# Patient Record
Sex: Male | Born: 1985 | Race: Black or African American | Hispanic: No | Marital: Single
Health system: Southern US, Community
[De-identification: ages and names within clinical notes are randomized; demographics above are authoritative.]

## PROBLEM LIST (undated history)

## (undated) HISTORY — PX: FINGER FRACTURE SURGERY: SHX638

---
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
## 2006-06-07 ENCOUNTER — Encounter: Admission: RE | Admit: 2006-06-07 | Discharge: 2006-06-07 | Payer: Self-pay | Admitting: Orthopedic Surgery

## 2006-06-28 ENCOUNTER — Encounter: Admission: RE | Admit: 2006-06-28 | Discharge: 2006-09-01 | Payer: Self-pay | Admitting: Orthopedic Surgery

## 2006-12-30 ENCOUNTER — Emergency Department (HOSPITAL_COMMUNITY): Admission: EM | Admit: 2006-12-30 | Discharge: 2006-12-30 | Payer: Self-pay | Admitting: Emergency Medicine

## 2007-01-01 ENCOUNTER — Emergency Department (HOSPITAL_COMMUNITY): Admission: EM | Admit: 2007-01-01 | Discharge: 2007-01-01 | Payer: Self-pay | Admitting: Emergency Medicine

## 2009-05-08 ENCOUNTER — Encounter: Admission: RE | Admit: 2009-05-08 | Discharge: 2009-05-08 | Payer: Self-pay | Admitting: Internal Medicine

## 2009-11-25 ENCOUNTER — Encounter: Admission: RE | Admit: 2009-11-25 | Discharge: 2009-11-25 | Payer: Self-pay | Admitting: Urology

## 2010-10-13 ENCOUNTER — Encounter: Payer: Self-pay | Admitting: Internal Medicine

## 2010-12-13 ENCOUNTER — Emergency Department (HOSPITAL_BASED_OUTPATIENT_CLINIC_OR_DEPARTMENT_OTHER)
Admission: EM | Admit: 2010-12-13 | Discharge: 2010-12-13 | Disposition: A | Discharge status: Home / Self Care | Payer: Managed Care, Other (non HMO) | Attending: Emergency Medicine | Admitting: Emergency Medicine

## 2010-12-13 ENCOUNTER — Emergency Department (INDEPENDENT_AMBULATORY_CARE_PROVIDER_SITE_OTHER): Payer: Managed Care, Other (non HMO)

## 2010-12-13 DIAGNOSIS — S62309A Unspecified fracture of unspecified metacarpal bone, initial encounter for closed fracture: Secondary | ICD-10-CM | POA: Insufficient documentation

## 2010-12-13 DIAGNOSIS — IMO0002 Reserved for concepts with insufficient information to code with codable children: Secondary | ICD-10-CM | POA: Insufficient documentation

## 2011-11-29 IMAGING — CR DG HAND COMPLETE 3+V*R*
3 series · 3 of 3 positions shown · non-contrast
Comparison: None

CLINICAL DATA: Injury, fourth finger pain.

RIGHT HAND - COMPLETE 3+ VIEW

[x hand pa right]
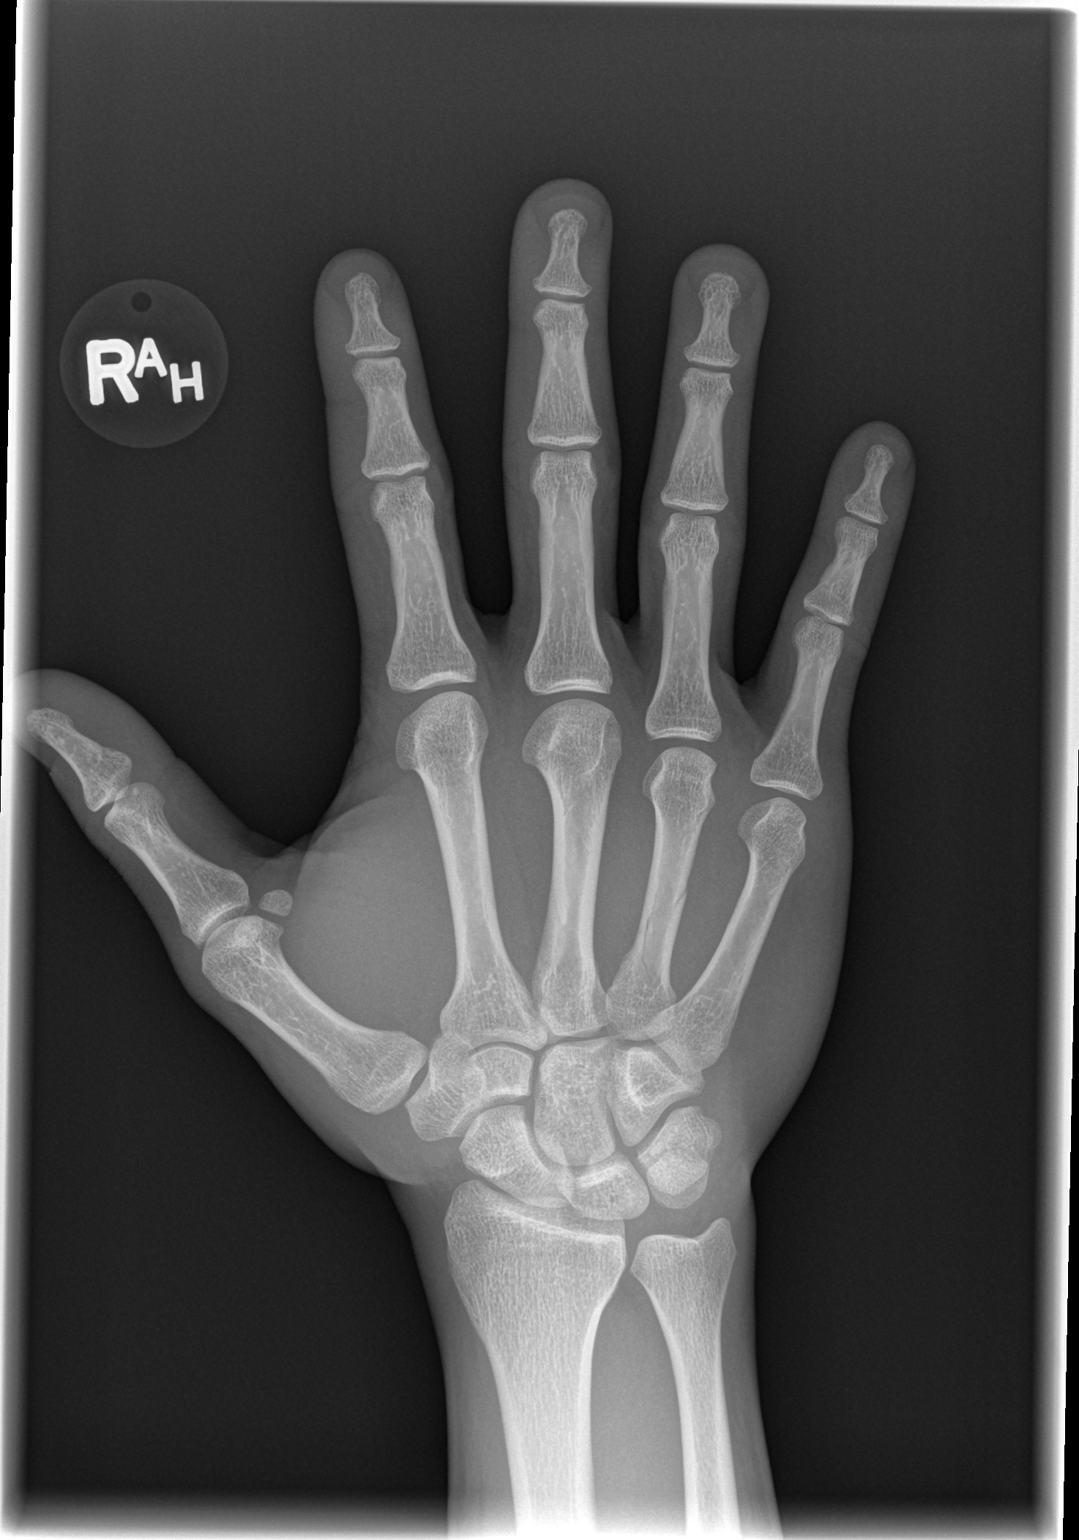

[x hand oblique right]
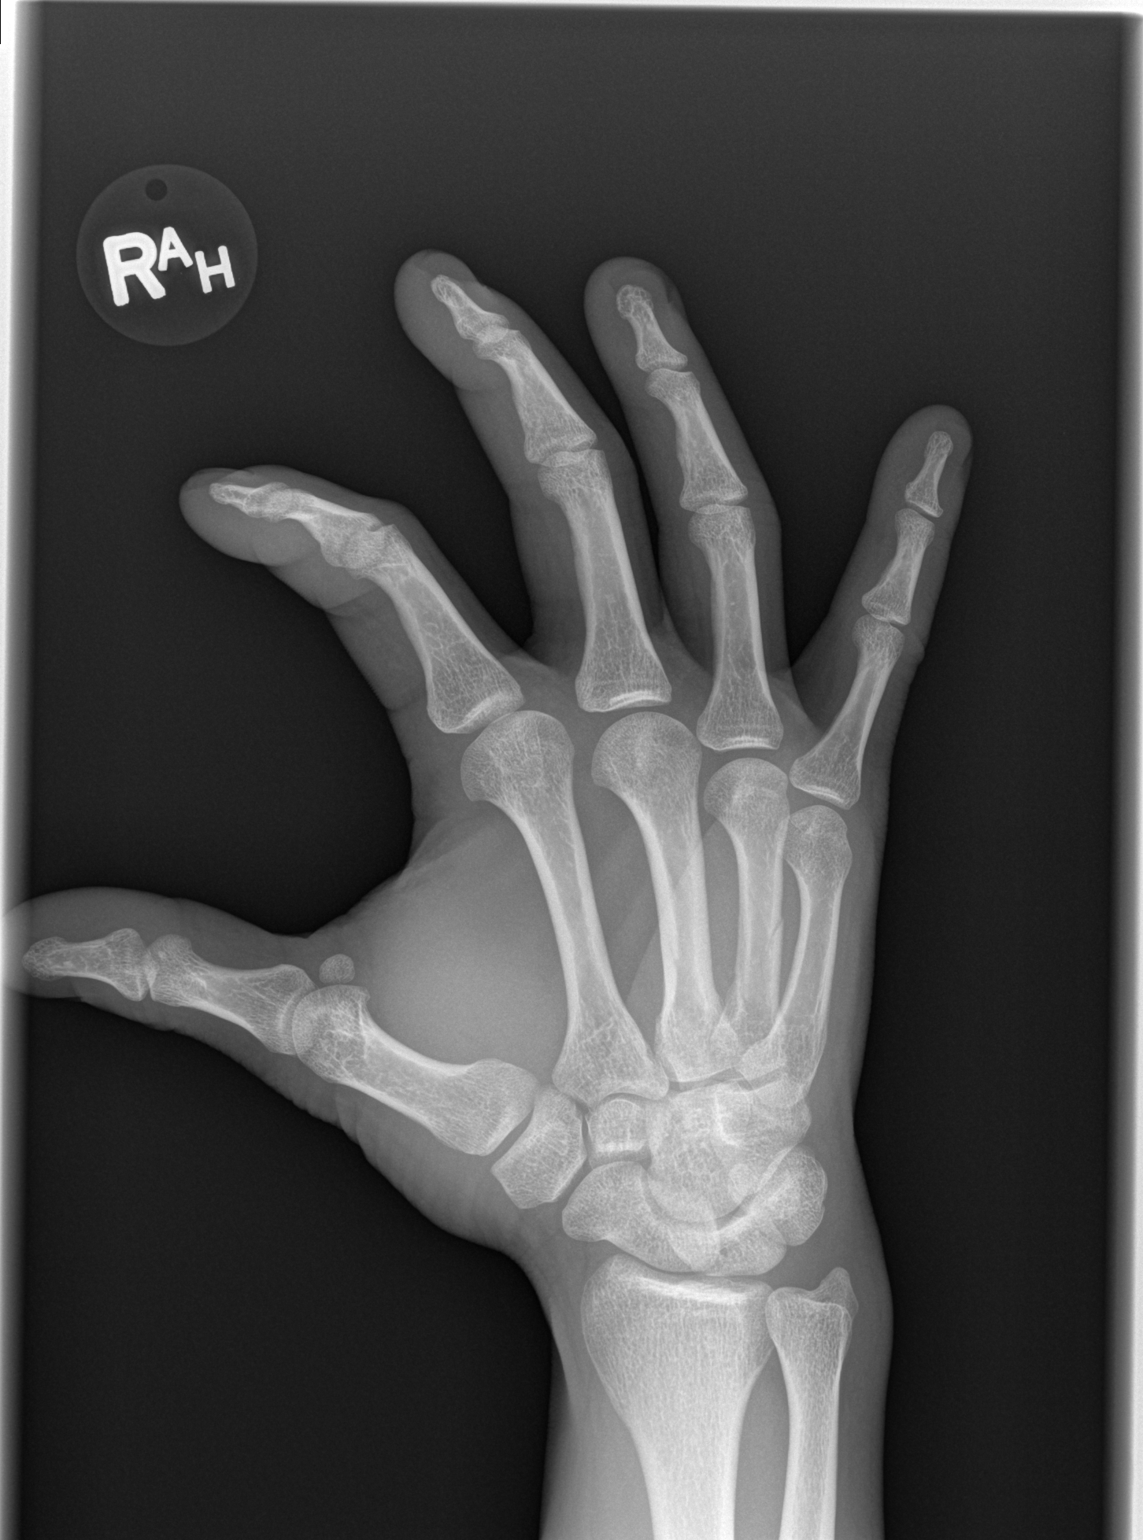

[x hand lat right]
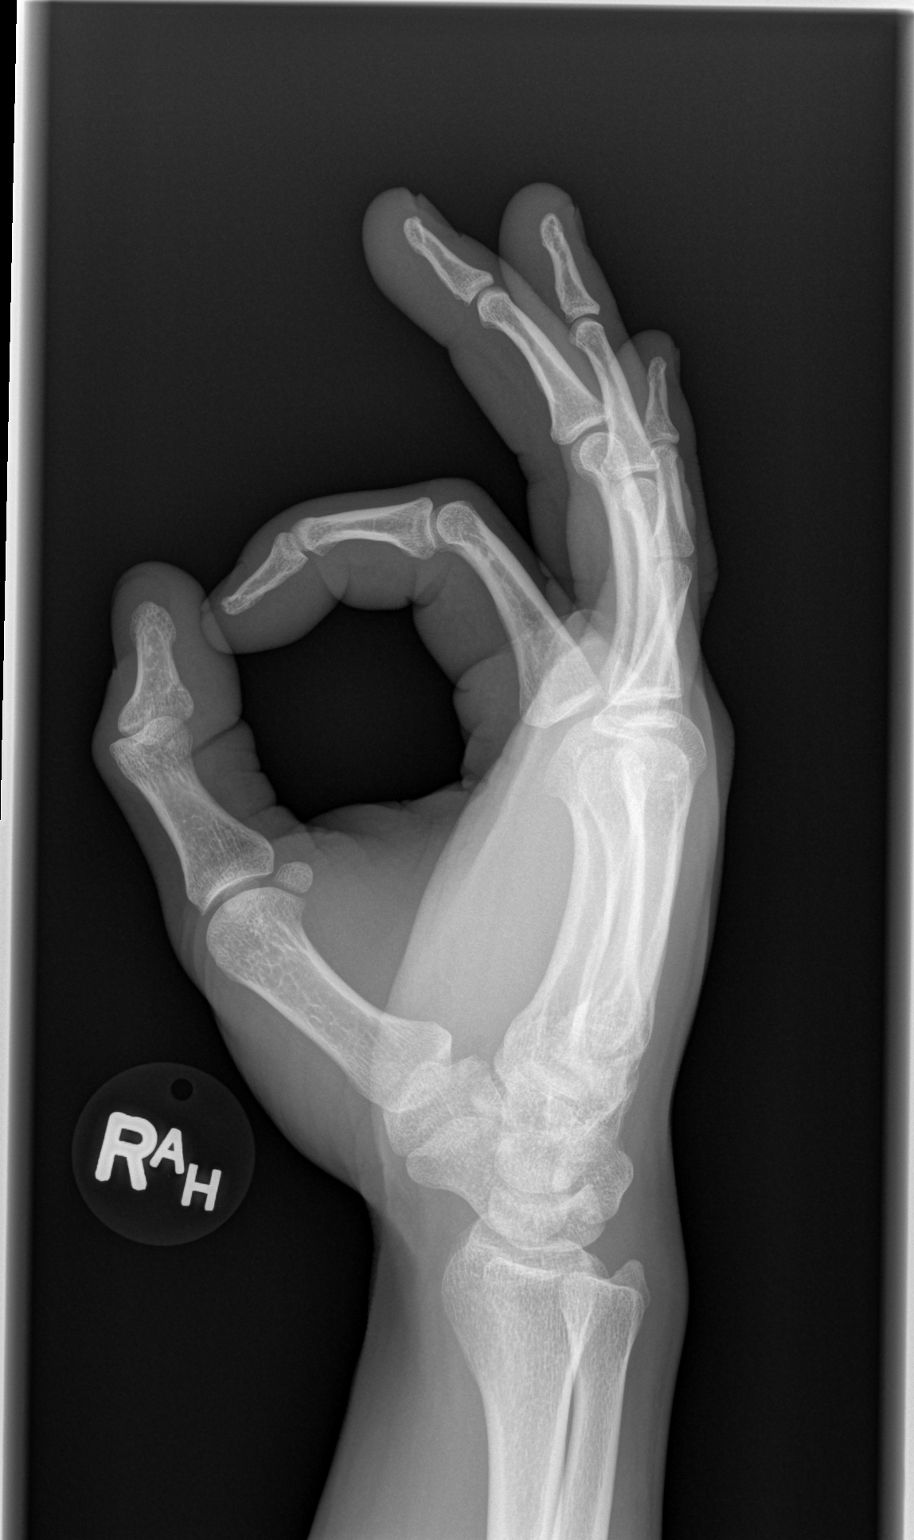

[3 of 3 positions shown; findings below may reference images not displayed]

FINDINGS: There is a nondisplaced right fourth metacarpal fracture.
No additional acute bony abnormality.  Soft tissues are intact.
IMPRESSION: Nondisplaced fourth metacarpal fracture.

## 2018-03-07 ENCOUNTER — Ambulatory Visit: Payer: Self-pay | Admitting: Podiatry

## 2022-05-12 ENCOUNTER — Ambulatory Visit (INDEPENDENT_AMBULATORY_CARE_PROVIDER_SITE_OTHER): Payer: BC Managed Care – PPO | Admitting: Podiatry

## 2022-05-12 ENCOUNTER — Ambulatory Visit (INDEPENDENT_AMBULATORY_CARE_PROVIDER_SITE_OTHER): Payer: BC Managed Care – PPO

## 2022-05-12 DIAGNOSIS — B070 Plantar wart: Secondary | ICD-10-CM

## 2022-05-12 DIAGNOSIS — M779 Enthesopathy, unspecified: Secondary | ICD-10-CM

## 2022-05-12 NOTE — Progress Notes (Signed)
   Chief Complaint  Patient presents with   Foot Problem    Bilateral foot pain.     Subjective: 36 y.o. male presenting today for evaluation of symptomatic skin lesions to the bilateral heels.  Patient states that the lesions are become thick and very painful.  He saw previous physician who debrided the area but he says that the lesion grew back.  It is very sensitive to walk on.  Patient stands on his feet throughout his entire work shift and experiences significant pain and tenderness throughout the day.  These lesions have been present for about 3 years now.  He presents for further treatment and evaluation    No past medical history on file.  Objective: Physical Exam General: The patient is alert and oriented x3 in no acute distress.   Dermatology: Hyperkeratotic skin lesions noted to the plantar aspect of the bilateral feet approximately 1 cm in diameter. Pinpoint bleeding noted upon debridement. Skin is warm, dry and supple bilateral lower extremities. Negative for open lesions or macerations.   Vascular: Palpable pedal pulses bilaterally. No edema or erythema noted. Capillary refill within normal limits.   Neurological: Epicritic and protective threshold grossly intact bilaterally.    Musculoskeletal Exam: Pain on palpation to the noted skin lesions.  Range of motion within normal limits to all pedal and ankle joints bilateral. Muscle strength 5/5 in all groups bilateral.    Assessment: 1. plantar wart bilateral feet    Plan of Care:  1. Patient was evaluated. 2. Excisional debridement of the plantar wart lesion(s) was performed using a chisel blade.  Salicylic acid was applied and the lesion(s) was dressed with a dry sterile dressing. 3. Glycylic salicylic acid wart remover was provided and dispensed at checkout to apply daily x4 weeks  4.  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 Felecia Shelling, DPM    [address]                                        [place], [place] [address]                Office [phone]  Fax [phone]

## 2022-06-12 ENCOUNTER — Ambulatory Visit: Payer: BC Managed Care – PPO | Admitting: Podiatry

## 2023-12-22 ENCOUNTER — Ambulatory Visit: Payer: Self-pay | Admitting: Family Medicine

## 2024-02-16 ENCOUNTER — Ambulatory Visit (INDEPENDENT_AMBULATORY_CARE_PROVIDER_SITE_OTHER): Admitting: Family Medicine

## 2024-02-16 ENCOUNTER — Encounter: Payer: Self-pay | Admitting: Family Medicine

## 2024-02-16 VITALS — BP 130/78 | HR 65 | Temp 97.1°F | Ht 71.0 in | Wt 205.4 lb

## 2024-02-16 DIAGNOSIS — Z0000 Encounter for general adult medical examination without abnormal findings: Secondary | ICD-10-CM | POA: Diagnosis not present

## 2024-02-16 DIAGNOSIS — Z13220 Encounter for screening for lipoid disorders: Secondary | ICD-10-CM

## 2024-02-16 DIAGNOSIS — Z23 Encounter for immunization: Secondary | ICD-10-CM | POA: Diagnosis not present

## 2024-02-16 DIAGNOSIS — Z1159 Encounter for screening for other viral diseases: Secondary | ICD-10-CM

## 2024-02-16 NOTE — Assessment & Plan Note (Signed)
 Overall health is excellent. Recommend ongoing regular exercise. Discussed recommended screenings and immunizations.

## 2024-02-16 NOTE — Progress Notes (Signed)
 [facility] [address] [place] [address] Dept: [phone] Dept Fax: [phone]  New Patient/Annual Physical Office Visit  Subjective:    Patient ID: Nicholas Fernandez, male    DOB: 06-Mar-1986, 38 y.o..   MRN: [id number]  Chief Complaint  Patient presents with   Establish Care    NP- establish care.   No concerns.     History of Present Illness:  Patient is in today to establish care. Mr. Trott was born in [place]. He attended 2 years at [employer], studying education. He then attended a semester at [employer] studying to be a Fish farm manager. 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 Mr. Mulford has been married for 2 years. he has six children (3-20). He denies use of tobacco, alcohol, or drugs.  Review of Systems  Constitutional:  Negative for chills, diaphoresis, fever, malaise/fatigue and weight loss.  HENT:  Negative for congestion, ear pain, hearing loss, sinus pain, sore throat and tinnitus.   Eyes:  Negative for blurred vision, pain, discharge and redness.  Respiratory:  Negative for cough, shortness of breath and wheezing.   Cardiovascular:  Negative for chest pain and palpitations.  Gastrointestinal:  Negative for abdominal pain, constipation, diarrhea, heartburn, nausea and vomiting.  Musculoskeletal:  Negative for back pain, joint pain and myalgias.  Skin:  Negative for itching and rash.  Psychiatric/Behavioral:  Negative for depression. The patient is not nervous/anxious.    Past Medical History: There are no active problems to display for this patient.  Past Surgical History:  Procedure Laterality Date   FINGER FRACTURE SURGERY     Family History  Problem Relation Age of Onset   Diabetes Father    No outpatient medications prior to visit.   No facility-administered medications prior to visit.   No Known Allergies Objective:   Today's Vitals   02/16/24  1526  BP: 130/78  Pulse: 65  Temp: (!) 97.1 F (36.2 C)  TempSrc: Temporal  SpO2: 99%  Weight: 205 lb 6.4 oz (93.2 kg)  Height: 5\' 11"  (1.803 m)   Body mass index is 28.65 kg/m.   General: Well developed, well nourished. No acute distress. HEENT: Normocephalic, non-traumatic. PERRL, EOMI. Conjunctiva clear. External ears normal. EAC and TMs normal   bilaterally. Nose clear without congestion or rhinorrhea. Mucous membranes moist. Oropharynx clear. Good dentition. Neck: Supple. No lymphadenopathy. No thyromegaly. Lungs: Clear to auscultation bilaterally. No wheezing, rales or rhonchi. CV: RRR without murmurs or rubs. Pulses 2+ bilaterally. Abdomen: Soft, non-tender. Bowel sounds positive, normal pitch and frequency. No hepatosplenomegaly. No rebound   or guarding. Extremities: Full ROM. No joint swelling or tenderness. No edema noted. Skin: Warm and dry. No rashes. Psych: Alert and oriented. Normal mood and affect.  Health Maintenance Due  Topic Date Due   HIV Screening  Never done   Hepatitis C Screening  Never done   DTaP/Tdap/Td (1 - Tdap) Never done    Assessment & Plan:   Problem List Items Addressed This Visit       Other   Annual physical exam - Primary   Overall health is excellent. Recommend ongoing regular exercise. Discussed recommended screenings and immunizations.       Other Visit Diagnoses       Need for Tdap vaccination       Relevant Orders   Tdap vaccine greater than or equal to 7yo IM (Completed)     Encounter for hepatitis C screening  test for low risk patient       Relevant Orders   HCV Ab w Reflex to Quant PCR     Screening for lipid disorders       Relevant Orders   Lipid panel       Return in about 1 year (around 02/15/2025) for Annual preventative care.   Graig Lawyer, MD

## 2024-02-17 ENCOUNTER — Ambulatory Visit: Payer: Self-pay | Admitting: Family Medicine

## 2024-02-17 LAB — LIPID PANEL
Cholesterol: 165 mg/dL (ref 0–200)
HDL: 45 mg/dL (ref >39.00)
LDL Cholesterol: 96 mg/dL (ref 0–99)
NonHDL: 120.29
Total CHOL/HDL Ratio: 4
Triglycerides: 120 mg/dL (ref 0.0–149.0)
VLDL: 24 mg/dL (ref 0.0–40.0)

## 2024-02-17 LAB — HCV AB W REFLEX TO QUANT PCR: HCV Ab: NONREACTIVE

## 2024-02-17 LAB — HCV INTERPRETATION

## 2024-02-28 DIAGNOSIS — F4320 Adjustment disorder, unspecified: Secondary | ICD-10-CM | POA: Diagnosis not present

## 2024-03-08 DIAGNOSIS — F4320 Adjustment disorder, unspecified: Secondary | ICD-10-CM | POA: Diagnosis not present
